# Patient Record
Sex: Male | Born: 1970 | Race: Black or African American | Hispanic: No | Marital: Single | Smoking: Never smoker
Health system: Southern US, Community
[De-identification: ages and names within clinical notes are randomized; demographics above are authoritative.]

## PROBLEM LIST (undated history)

## (undated) HISTORY — PX: KNEE SURGERY: SHX244

---

## 2013-10-24 ENCOUNTER — Encounter (HOSPITAL_COMMUNITY): Payer: Self-pay | Admitting: Emergency Medicine

## 2013-10-24 ENCOUNTER — Emergency Department (HOSPITAL_COMMUNITY)
Admission: EM | Admit: 2013-10-24 | Discharge: 2013-10-24 | Disposition: A | Discharge status: Home / Self Care | Attending: Emergency Medicine | Admitting: Emergency Medicine

## 2013-10-24 ENCOUNTER — Emergency Department (HOSPITAL_COMMUNITY)

## 2013-10-24 DIAGNOSIS — R0602 Shortness of breath: Secondary | ICD-10-CM | POA: Insufficient documentation

## 2013-10-24 DIAGNOSIS — R110 Nausea: Secondary | ICD-10-CM | POA: Insufficient documentation

## 2013-10-24 DIAGNOSIS — R079 Chest pain, unspecified: Secondary | ICD-10-CM | POA: Insufficient documentation

## 2013-10-24 DIAGNOSIS — R61 Generalized hyperhidrosis: Secondary | ICD-10-CM | POA: Insufficient documentation

## 2013-10-24 LAB — BASIC METABOLIC PANEL
BUN: 15 mg/dL (ref 6–23)
CHLORIDE: 99 meq/L (ref 96–112)
CO2: 29 meq/L (ref 19–32)
Calcium: 9.5 mg/dL (ref 8.4–10.5)
Creatinine, Ser: 1.69 mg/dL — ABNORMAL HIGH (ref 0.50–1.35)
GFR calc Af Amer: 56 mL/min — ABNORMAL LOW (ref >90)
GFR calc non Af Amer: 49 mL/min — ABNORMAL LOW (ref >90)
Glucose, Bld: 99 mg/dL (ref 70–99)
Potassium: 4.1 mEq/L (ref 3.7–5.3)
Sodium: 138 mEq/L (ref 137–147)

## 2013-10-24 LAB — CBC WITH DIFFERENTIAL/PLATELET
BASOS ABS: 0 10*3/uL (ref 0.0–0.1)
Basophils Relative: 1 % (ref 0–1)
Eosinophils Absolute: 0.1 10*3/uL (ref 0.0–0.7)
Eosinophils Relative: 2 % (ref 0–5)
HCT: 43.7 % (ref 39.0–52.0)
Hemoglobin: 14.4 g/dL (ref 13.0–17.0)
LYMPHS PCT: 40 % (ref 12–46)
Lymphs Abs: 1.9 10*3/uL (ref 0.7–4.0)
MCH: 27.8 pg (ref 26.0–34.0)
MCHC: 33 g/dL (ref 30.0–36.0)
MCV: 84.4 fL (ref 78.0–100.0)
MONO ABS: 0.5 10*3/uL (ref 0.1–1.0)
Monocytes Relative: 10 % (ref 3–12)
NEUTROS ABS: 2.3 10*3/uL (ref 1.7–7.7)
Neutrophils Relative %: 47 % (ref 43–77)
Platelets: 193 10*3/uL (ref 150–400)
RBC: 5.18 MIL/uL (ref 4.22–5.81)
RDW: 14.4 % (ref 11.5–15.5)
WBC: 4.7 10*3/uL (ref 4.0–10.5)

## 2013-10-24 LAB — TROPONIN I: Troponin I: <0.3 ng/mL (ref <0.30)

## 2013-10-24 NOTE — ED Notes (Signed)
Pain lt chest for 1 month, Pt is a prisoner with guard.  No cough or sob

## 2013-10-24 NOTE — ED Provider Notes (Signed)
CSN: [id number]     Arrival date & time 10/24/13  1832 History  This chart was scribed for Donnetta HutchingBrian Jaline Pincock, MD by Danella Maiersaroline Early, ED Scribe. This patient was seen in room APA01/APA01 and the patient's care was started at 7:16 PM.    Chief Complaint  Patient presents with  . Chest Pain     (Consider location/radiation/quality/duration/timing/severity/associated sxs/prior Treatment) The history is provided by the patient. No language interpreter was used.   HPI Comments: Jason Bradford is a 43 y.o. male [facility] complaining of left-sided intermittent CP described as pressure onset one month ago. He reports having an episode of CP last night that went away after eructation. Nothing specific brings the CP on and nothing makes it better or worse. He denies having CP currently. He reports SOB with exertion. He reports diaphoresis and nausea if he exercises for one minute. The CP does not worsen with exertion. He denies cough. He is not on any medications. He does not smoke.    History reviewed. No pertinent past medical history. Past Surgical History  Procedure Laterality Date  . Knee surgery     History reviewed. No pertinent family history. History  Substance Use Topics  . Smoking status: Never Smoker   . Smokeless tobacco: Not on file  . Alcohol Use: No    Review of Systems  Constitutional: Positive for diaphoresis.  Respiratory: Positive for shortness of breath. Negative for cough.   Cardiovascular: Positive for chest pain.  Gastrointestinal: Positive for nausea.   A complete 10 system review of systems was obtained and all systems are negative except as noted in the HPI and PMH.     Allergies  Review of patient's allergies indicates no known allergies.  Home Medications  No current outpatient prescriptions on file. BP 138/84  Pulse 83  Temp(Src) 98.7 F (37.1 C) (Oral)  Resp 18  Ht 6' (1.829 m)  Wt 255 lb (115.667  kg)  BMI 34.58 kg/m2  SpO2 98% Physical Exam  Nursing note and vitals reviewed. Constitutional: He is oriented to person, place, and time. He appears well-developed and well-nourished.  HENT:  Head: Normocephalic and atraumatic.  Eyes: Conjunctivae and EOM are normal. Pupils are equal, round, and reactive to light.  Neck: Normal range of motion. Neck supple.  Cardiovascular: Normal rate, regular rhythm and normal heart sounds.   Pulmonary/Chest: Effort normal and breath sounds normal.  Abdominal: Soft. Bowel sounds are normal.  Musculoskeletal: Normal range of motion.  Neurological: He is alert and oriented to person, place, and time.  Skin: Skin is warm and dry.  Psychiatric: He has a normal mood and affect. His behavior is normal.    ED Course  Procedures (including critical care time) Medications - No data to display  DIAGNOSTIC STUDIES: Oxygen Saturation is 98% on RA, normal by my interpretation.    COORDINATION OF CARE: 7:51 PM- Discussed treatment plan with pt. Pt agrees to plan.    Labs Review Labs Reviewed  BASIC METABOLIC PANEL - Abnormal; Notable for the following:    Creatinine, Ser 1.69 (*)    GFR calc non Af Amer 49 (*)    GFR calc Af Amer 56 (*)    All other components within normal limits  CBC WITH DIFFERENTIAL  TROPONIN I   Imaging Review Dg Chest 2 View  10/24/2013   CLINICAL DATA:  Chest pain  EXAM: CHEST  2 VIEW  COMPARISON:  None.  FINDINGS: The heart  size and mediastinal contours are within normal limits. Both lungs are clear. The visualized skeletal structures are unremarkable.  IMPRESSION: No active cardiopulmonary disease.   Electronically Signed   By: Ruel Favors M.D.   On: 10/24/2013 19:39     EKG Interpretation   Date/Time:  Tuesday October 24 2013 18:43:51 EDT Ventricular Rate:  79 PR Interval:  202 QRS Duration: 80 QT Interval:  372 QTC Calculation: 426 R Axis:   0 Text Interpretation:  Normal sinus rhythm Cannot rule out Anterior  infarct  , age undetermined Abnormal ECG No previous ECGs available Confirmed by  Welton Bord  MD, Adalyna Godbee ([id number]) on 10/24/2013 10:16:00 PM      MDM   Final diagnoses:  Chest pain    Cardiac risk factor profile low. Patient is symptom-free. EKG and troponin negative. Creatinine slightly elevated. [facility].  I personally performed the services described in this documentation, which was scribed in my presence. The recorded information has been reviewed and is accurate.    Donnetta Hutching, MD 10/24/13 [id number]

## 2013-10-24 NOTE — Discharge Instructions (Signed)
Chest Pain (Nonspecific) Chest pain has many causes. Your pain could be caused by something serious, such as a heart attack or a blood clot in the lungs. It could also be caused by something less serious, such as a chest bruise or a virus. Follow up with your doctor. More lab tests or other studies may be needed to find the cause of your pain. Most of the time, nonspecific chest pain will improve within 2 to 3 days of rest and mild pain medicine. HOME CARE  For chest bruises, you may put ice on the sore area for 15-20 minutes, 03-04 times a day. Do this only if it makes you feel better.  Put ice in a plastic bag.  Place a towel between the skin and the bag.  Rest for the next 2 to 3 days.  Go back to work if the pain improves.  See your doctor if the pain lasts longer than 1 to 2 weeks.  Only take medicine as told by your doctor.  Quit smoking if you smoke. GET HELP RIGHT AWAY IF:   There is more pain or pain that spreads to the arm, neck, jaw, back, or belly (abdomen).  You have shortness of breath.  You cough more than usual or cough up blood.  You have very bad back or belly pain, feel sick to your stomach (nauseous), or throw up (vomit).  You have very bad weakness.  You pass out (faint).  You have a fever. Any of these problems may be serious and may be an emergency. Do not wait to see if the problems will go away. Get medical help right away. Call your local emergency services 911 in [place] Do not drive yourself to the hospital. MAKE SURE YOU:   Understand these instructions.  Will watch this condition.  Will get help right away if you or your child is not doing well or gets worse. Document Released: 12/30/2007 Document Revised: 10/05/2011 Document Reviewed: 12/30/2007 [facility] Patient Information 2014 [employer] [place].  Tests were normal with the exception of a kidney test which was slightly elevated. Creatinine was 1.69.  If symptoms persist you may need to see a  cardiologist.

## 2015-04-22 IMAGING — CR DG CHEST 2V
2 series · 2 of 2 positions shown · non-contrast
Comparison: None.

CLINICAL DATA: Chest pain

EXAM:
CHEST  2 VIEW

[view not recorded (1 of 2)]
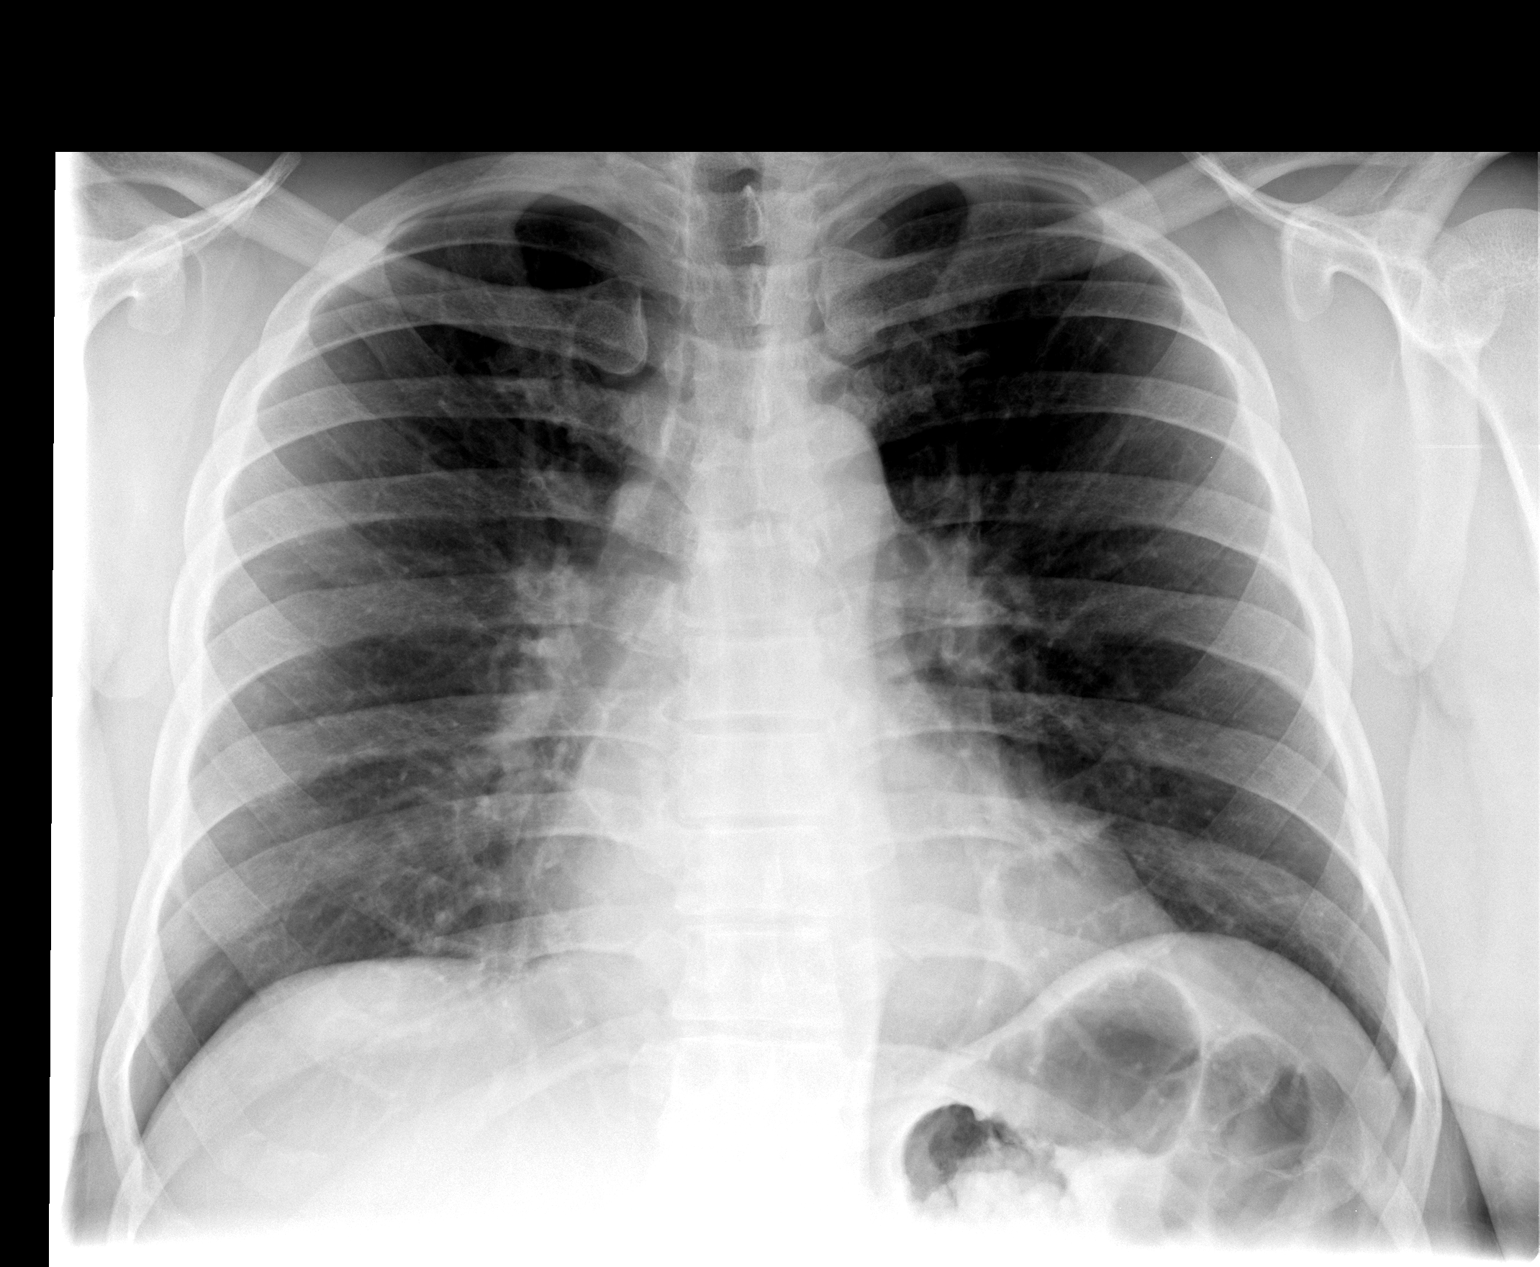

[view not recorded (2 of 2)]
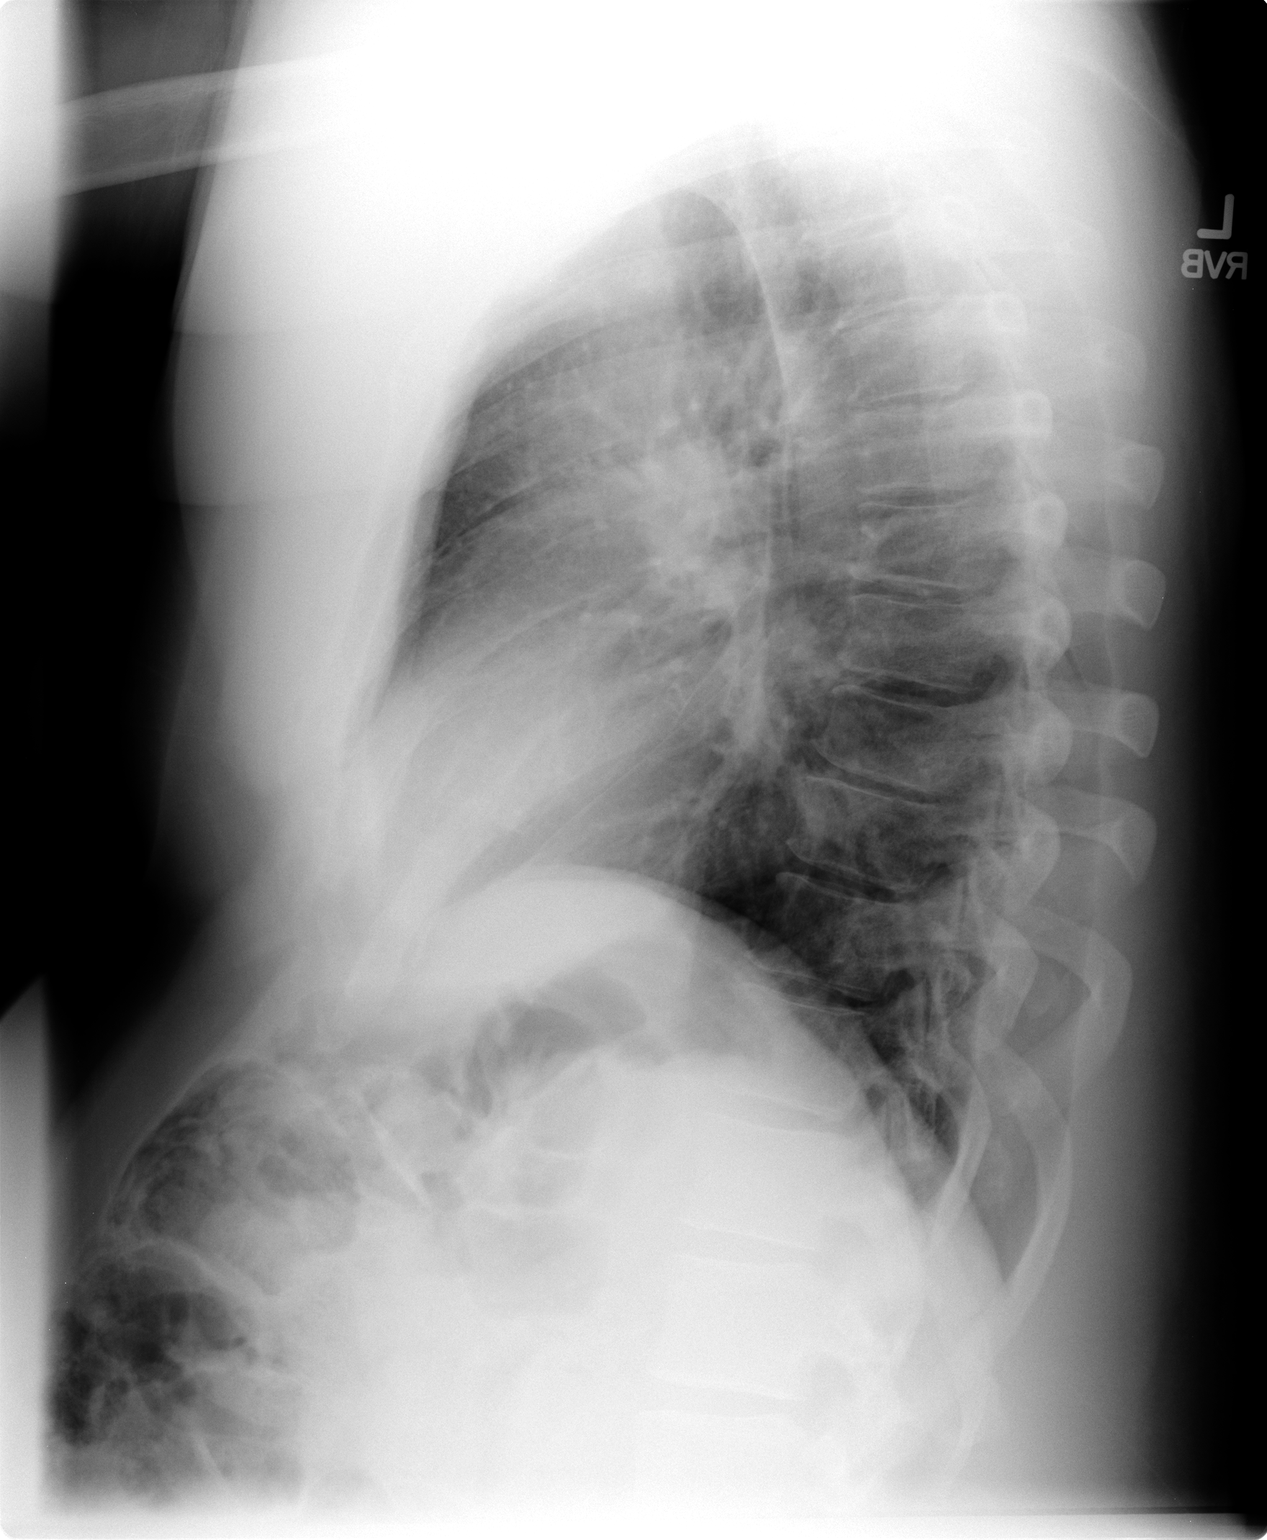

[2 of 2 positions shown; findings below may reference images not displayed]

FINDINGS: The heart size and mediastinal contours are within normal limits.
Both lungs are clear. The visualized skeletal structures are
unremarkable.
IMPRESSION: No active cardiopulmonary disease.
